# Patient Record
Sex: Male | Born: 1995 | Race: White | Hispanic: No | Marital: Single | Smoking: Never smoker
Health system: Southern US, Community
[De-identification: ages and names within clinical notes are randomized; demographics above are authoritative.]

## PROBLEM LIST (undated history)

## (undated) DIAGNOSIS — K649 Unspecified hemorrhoids: Secondary | ICD-10-CM

## (undated) DIAGNOSIS — T7840XA Allergy, unspecified, initial encounter: Secondary | ICD-10-CM

## (undated) HISTORY — DX: Unspecified hemorrhoids: K64.9

## (undated) HISTORY — DX: Allergy, unspecified, initial encounter: T78.40XA

---
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
## 2014-03-16 ENCOUNTER — Ambulatory Visit: Payer: Self-pay | Admitting: Family Medicine

## 2014-03-16 DIAGNOSIS — I861 Scrotal varices: Secondary | ICD-10-CM | POA: Insufficient documentation

## 2016-09-27 DIAGNOSIS — K649 Unspecified hemorrhoids: Secondary | ICD-10-CM | POA: Insufficient documentation

## 2016-09-29 ENCOUNTER — Encounter: Payer: Self-pay | Admitting: Family Medicine

## 2016-09-29 ENCOUNTER — Ambulatory Visit (INDEPENDENT_AMBULATORY_CARE_PROVIDER_SITE_OTHER): Payer: No Typology Code available for payment source | Admitting: Family Medicine

## 2016-09-29 VITALS — BP 122/78 | HR 78 | Temp 99.0°F | Resp 16 | Ht 72.0 in | Wt 183.0 lb

## 2016-09-29 DIAGNOSIS — L659 Nonscarring hair loss, unspecified: Secondary | ICD-10-CM | POA: Diagnosis not present

## 2016-09-29 DIAGNOSIS — Z0000 Encounter for general adult medical examination without abnormal findings: Secondary | ICD-10-CM | POA: Diagnosis not present

## 2016-09-29 DIAGNOSIS — Z23 Encounter for immunization: Secondary | ICD-10-CM

## 2016-09-29 NOTE — Patient Instructions (Signed)
   Can use OTC Debrox periodically for ear wax   Use OTC Cetaphil cream daily for dry cracked skin

## 2016-09-29 NOTE — Progress Notes (Signed)
Patient: Donald Bush, Male    DOB: Aug 25, 1996, 20 y.o.   MRN: [id number] Visit Date: 09/29/2016  Today's Provider: Lelon Huh, MD   Chief Complaint  Patient presents with  . Annual Exam  . Alopecia   Subjective:    Annual physical exam Donald Bush is a 20 y.o. male who presents today for health maintenance and complete physical. He feels well. He reports exercising occasionally. He reports he is sleeping well.Marland Kitchen He is doing well in school, anticipates graduating from [employer] with accounting degree next year.    Hair loss: Patient reports that he has noticed hair thinning for about 4 months. Also notes some flaking and drying of skin.   Review of Systems  Constitutional: Negative.   HENT: Negative.   Eyes: Negative.   Respiratory: Negative.   Cardiovascular: Negative.   Gastrointestinal: Negative.   Endocrine: Negative.   Genitourinary: Negative.   Musculoskeletal: Negative.   Skin: Negative.   Allergic/Immunologic: Negative.   Neurological: Negative.   Hematological: Negative.   Psychiatric/Behavioral: Negative.     Social History      He  reports that he has never smoked. He has never used smokeless tobacco. He reports that he does not drink alcohol or use drugs.       Social History   Social History  . Marital status: Single    Spouse name: N/A  . Number of children: N/A  . Years of education: N/A   Occupational History  . student     [employer]- studying [employer]ness   Social History Main Topics  . Smoking status: Never Smoker  . Smokeless tobacco: Never Used  . Alcohol use No  . Drug use: No  . Sexual activity: Not Asked   Other Topics Concern  . None   Social History Narrative  . None    Past Medical History:  Diagnosis Date  . Allergy   . Hemorrhoids      Patient Active Problem List   Diagnosis Date Noted  . Hemorrhoid 09/27/2016  . Left varicocele 03/16/2014    Past Surgical History:  Procedure  Laterality Date  . TONSILLECTOMY AND ADENOIDECTOMY  2002    Family History        Family Status  Relation Status  . Mother Alive  . Father Alive  . Maternal Grandmother Deceased  . Paternal Grandmother Alive  . Paternal Grandfather   . Brother Alive        His family history includes Breast cancer in his maternal grandmother and paternal grandmother; CAD in his paternal grandfather; Diabetes in his paternal grandfather.     No Known Allergies   Current Outpatient Prescriptions:  .  diphenhydrAMINE (BENADRYL ALLERGY) 25 MG tablet, Take 1 tablet by mouth daily as needed., Disp: , Rfl:    Patient Care Team: Birdie Sons, MD as PCP - General (Family Medicine)      Objective:   Vitals: BP 122/78 (BP Location: Left Arm, Patient Position: Sitting, Cuff Size: Normal)   Pulse 78   Temp 99 F (37.2 C)   Resp 16   Ht 6' (1.829 m)   Wt 183 lb (83 kg)   SpO2 97%   BMI 24.82 kg/m    Physical Exam   General Appearance:    Alert, cooperative, no distress, appears stated age  Head:    Normocephalic, without obvious abnormality, atraumatic  Eyes:    PERRL, conjunctiva/corneas clear, EOM's intact, fundi  benign, both eyes       Ears:    Normal TM's and external ear canals, both ears  Nose:   Nares normal, septum midline, mucosa normal, no drainage   or sinus tenderness  Throat:   Lips, mucosa, and tongue normal; teeth and gums normal  Neck:   Supple, symmetrical, trachea midline, no adenopathy;       thyroid:  No enlargement/tenderness/nodules; no carotid   bruit or JVD  Back:     Symmetric, no curvature, ROM normal, no CVA tenderness  Lungs:     Clear to auscultation bilaterally, respirations unlabored  Chest wall:    No tenderness or deformity  Heart:    Regular rate and rhythm, S1 and S2 normal, no murmur, rub   or gallop  Abdomen:     Soft, non-tender, bowel sounds active all four quadrants,    no masses, no organomegaly  Genitalia:    penis: no lesions or  discharge. testes: no masses or tenderness. no hernias  Rectal:    deferred  Extremities:   Extremities normal, atraumatic, no cyanosis or edema  Pulses:   2+ and symmetric all extremities  Skin:   Skin color, texture, turgor normal, no rashes or lesions  Lymph nodes:   Cervical, supraclavicular, and axillary nodes normal  Neurologic:   CNII-XII intact. Normal strength, sensation and reflexes      throughout     Depression Screen No flowsheet data found.    Assessment & Plan:     Routine Health Maintenance and Physical Exam  Exercise Activities and Dietary recommendations Goals    None      Immunization History  Administered Date(s) Administered  . DTaP 04/03/1995, 05/21/1995, 07/23/1996, 04/19/2000  . HPV Quadrivalent 03/11/2014  . Hepatitis A 03/12/2013, 03/11/2014  . Hepatitis B 12/28/1994, 04/03/1995, 07/24/1995  . HiB (PRP-OMP) 04/03/1995, 05/21/1995, 07/24/1995, 04/24/1996  . IPV 04/03/1995, 05/21/1995, 07/24/1995, 04/19/2000  . MMR 04/24/1996, 04/19/2000  . Meningococcal Conjugate 03/12/2013  . Tdap 04/01/2008  . Varicella 04/24/1996, 04/01/2008    Health Maintenance  Topic Date Due  . HIV Screening  01/10/2010  . INFLUENZA VACCINE  05/02/2016  . TETANUS/TDAP  04/01/2018     Discussed health benefits of physical activity, and encouraged him to engage in regular exercise appropriate for his age and condition.    1. Annual physical exam   2. Alopecia  - T4 AND TSH - Comprehensive metabolic panel - CBC  3. Need for HPV vaccination  - HPV 9-valent vaccine,Recombinat    Lelon Huh, MD  [facility]

## 2016-09-30 LAB — COMPREHENSIVE METABOLIC PANEL
A/G RATIO: 1.9 (ref 1.2–2.2)
ALT: 47 IU/L — AB (ref 0–44)
AST: 24 IU/L (ref 0–40)
Albumin: 4.5 g/dL (ref 3.5–5.5)
Alkaline Phosphatase: 46 IU/L (ref 39–117)
BUN/Creatinine Ratio: 13 (ref 9–20)
BUN: 13 mg/dL (ref 6–20)
Bilirubin Total: 0.5 mg/dL (ref 0.0–1.2)
CALCIUM: 9.6 mg/dL (ref 8.7–10.2)
CO2: 25 mmol/L (ref 18–29)
CREATININE: 1.04 mg/dL (ref 0.76–1.27)
Chloride: 99 mmol/L (ref 96–106)
GFR, EST AFRICAN AMERICAN: 118 mL/min/{1.73_m2} (ref >59)
GFR, EST NON AFRICAN AMERICAN: 102 mL/min/{1.73_m2} (ref >59)
GLUCOSE: 91 mg/dL (ref 65–99)
Globulin, Total: 2.4 g/dL (ref 1.5–4.5)
Potassium: 4.3 mmol/L (ref 3.5–5.2)
Sodium: 140 mmol/L (ref 134–144)
TOTAL PROTEIN: 6.9 g/dL (ref 6.0–8.5)

## 2016-09-30 LAB — CBC
HEMATOCRIT: 45.3 % (ref 37.5–51.0)
HEMOGLOBIN: 15.7 g/dL (ref 13.0–17.7)
MCH: 29.7 pg (ref 26.6–33.0)
MCHC: 34.7 g/dL (ref 31.5–35.7)
MCV: 86 fL (ref 79–97)
Platelets: 236 10*3/uL (ref 150–379)
RBC: 5.29 x10E6/uL (ref 4.14–5.80)
RDW: 12.6 % (ref 12.3–15.4)
WBC: 9.2 10*3/uL (ref 3.4–10.8)

## 2016-09-30 LAB — T4 AND TSH
T4 TOTAL: 6.3 ug/dL (ref 4.5–12.0)
TSH: 0.624 u[IU]/mL (ref 0.450–4.500)

## 2016-10-03 ENCOUNTER — Telehealth: Payer: Self-pay

## 2016-10-03 NOTE — Telephone Encounter (Signed)
-----   Message from Malva Limesonald E Fisher, MD sent at 09/30/2016  9:02 AM EST ----- Labs are all completely normal. No sign of any metabolic conditions that would affect skin or hair growth.

## 2016-10-03 NOTE — Telephone Encounter (Signed)
Patient advised as below.  

## 2017-04-02 ENCOUNTER — Ambulatory Visit: Payer: No Typology Code available for payment source | Admitting: Family Medicine

## 2019-02-28 ENCOUNTER — Ambulatory Visit: Payer: No Typology Code available for payment source | Admitting: Family Medicine

## 2019-02-28 ENCOUNTER — Other Ambulatory Visit: Payer: Self-pay

## 2019-02-28 ENCOUNTER — Encounter: Payer: Self-pay | Admitting: Family Medicine

## 2019-02-28 VITALS — BP 106/70 | HR 66 | Temp 98.5°F | Resp 16 | Wt 151.4 lb

## 2019-02-28 DIAGNOSIS — M940 Chondrocostal junction syndrome [Tietze]: Secondary | ICD-10-CM

## 2019-02-28 NOTE — Progress Notes (Signed)
Patient: Donald Bush Male    DOB: 1995-12-25   23 y.o.   MRN: [id number] Visit Date: 02/28/2019  Today's Provider: Dortha Kernennis Barbi Kumagai, PA   Chief Complaint  Patient presents with  . Abdominal Pain   Subjective:     Abdominal Pain  This is a new problem. The current episode started 1 to 4 weeks ago. The onset quality is sudden. The problem occurs constantly. The problem has been unchanged. The pain is located in the LUQ. The patient is experiencing no pain. Quality: patient describes discomfort as a tightness feeeling. The abdominal pain does not radiate. Associated symptoms include constipation, myalgias and weight loss. Pertinent negatives include no anorexia, arthralgias, belching, diarrhea, dysuria, fever, flatus, frequency, headaches, hematochezia, hematuria, melena, nausea or vomiting. The pain is aggravated by certain positions and movement. The pain is relieved by nothing. Treatments tried: miralax.   Past Medical History:  Diagnosis Date  . Allergy   . Hemorrhoids    Past Surgical History:  Procedure Laterality Date  . TONSILLECTOMY AND ADENOIDECTOMY  2002   Family History  Problem Relation Age of Onset  . Breast cancer Maternal Grandmother   . Breast cancer Paternal Grandmother   . Diabetes Paternal Grandfather   . CAD Paternal Grandfather    No Known Allergies  No current outpatient medications on file.  Review of Systems  Constitutional: Positive for weight loss. Negative for fever.  Gastrointestinal: Positive for abdominal pain and constipation. Negative for anorexia, diarrhea, flatus, hematochezia, melena, nausea and vomiting.  Genitourinary: Negative for dysuria, frequency and hematuria.  Musculoskeletal: Positive for myalgias. Negative for arthralgias.  Neurological: Negative for headaches.   Social History   Tobacco Use  . Smoking status: Never Smoker  . Smokeless tobacco: Never Used  Substance Use Topics  . Alcohol use: No     Objective:    BP 106/70   Pulse 66   Temp 98.5 F (36.9 C) (Oral)   Resp 16   Wt 151 lb 6.4 oz (68.7 kg)   SpO2 97%   BMI 20.53 kg/m    Wt Readings from Last 3 Encounters:  02/28/19 151 lb 6.4 oz (68.7 kg)  09/29/16 183 lb (83 kg)  03/11/14 176 lb (79.8 kg) (79 %, Z= 0.82)*   * Growth percentiles are based on CDC (Boys, 2-20 Years) data.   Vitals:   02/28/19 1507  BP: 106/70  Pulse: 66  Resp: 16  Temp: 98.5 F (36.9 C)  TempSrc: Oral  SpO2: 97%  Weight: 151 lb 6.4 oz (68.7 kg)   Physical Exam Constitutional:      General: He is not in acute distress.    Appearance: He is well-developed.  HENT:     Head: Normocephalic and atraumatic.     Right Ear: Hearing normal.     Left Ear: Hearing normal.     Nose: Nose normal.  Eyes:     General: Lids are normal. No scleral icterus.       Right eye: No discharge.        Left eye: No discharge.     Conjunctiva/sclera: Conjunctivae normal.  Cardiovascular:     Rate and Rhythm: Normal rate and regular rhythm.     Heart sounds: Normal heart sounds.  Pulmonary:     Effort: Pulmonary effort is normal. No respiratory distress.     Breath sounds: Normal breath sounds. No wheezing, rhonchi or rales.     Comments: Slight tenderness along the left  costal margin with prominence of the cartilage. Chest:     Chest wall: Tenderness present.  Abdominal:     General: Bowel sounds are normal. There is no distension.     Palpations: Abdomen is soft.  Musculoskeletal: Normal range of motion.  Skin:    Findings: No lesion or rash.  Neurological:     Mental Status: He is alert and oriented to person, place, and time.  Psychiatric:        Speech: Speech normal.        Behavior: Behavior normal.        Thought Content: Thought content normal.       Assessment & Plan    1. Costochondritis Bent over and felt a "pop" in the left costal margin about a week ago. Went to an Urgent Care Clinic for the persistent "tightness sensation". Diagnosed with  constipation and treated with Miralax. It helped the constipation but still has some discomfort in the cartilage margin of the left lower ribs. Has a history of exercising by doing pull-ups 5-6 days a week and some abdominal crunches. Suspect costochondritis and may use Aleve with a rib belt for support when active. Recheck in 1-2 weeks if needed for possible x-ray of ribs.     Dortha Kern, PA  [facility] Leo Rod Morse as a Neurosurgeon for [employer].,have documented all relevant documentation on the behalf of [employer],as directed by  [employer] while in the presence of [employer], [place].
